# Patient Record
Sex: Male | Born: 1985 | Race: Black or African American | Hispanic: No | Marital: Married | Smoking: Current some day smoker
Health system: Southern US, Community
[De-identification: ages and names within clinical notes are randomized; demographics above are authoritative.]

## PROBLEM LIST (undated history)

## (undated) HISTORY — PX: ROTATOR CUFF REPAIR: SHX139

---
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
## 2006-07-01 ENCOUNTER — Emergency Department (HOSPITAL_COMMUNITY): Admission: EM | Admit: 2006-07-01 | Discharge: 2006-07-01 | Payer: Self-pay | Admitting: Emergency Medicine

## 2006-09-01 ENCOUNTER — Emergency Department (HOSPITAL_COMMUNITY): Admission: EM | Admit: 2006-09-01 | Discharge: 2006-09-01 | Payer: Self-pay | Admitting: Emergency Medicine

## 2007-09-13 ENCOUNTER — Emergency Department (HOSPITAL_COMMUNITY): Admission: EM | Admit: 2007-09-13 | Discharge: 2007-09-13 | Payer: Self-pay | Admitting: Emergency Medicine

## 2007-12-12 ENCOUNTER — Emergency Department (HOSPITAL_COMMUNITY): Admission: EM | Admit: 2007-12-12 | Discharge: 2007-12-12 | Payer: Self-pay | Admitting: Emergency Medicine

## 2009-01-09 ENCOUNTER — Emergency Department (HOSPITAL_COMMUNITY): Admission: EM | Admit: 2009-01-09 | Discharge: 2009-01-09 | Payer: Self-pay | Admitting: Emergency Medicine

## 2011-09-28 LAB — STREP A DNA PROBE: Group A Strep Probe: NEGATIVE

## 2011-09-28 LAB — RAPID STREP SCREEN (MED CTR MEBANE ONLY): Streptococcus, Group A Screen (Direct): NEGATIVE

## 2011-10-04 LAB — RAPID STREP SCREEN (MED CTR MEBANE ONLY): Streptococcus, Group A Screen (Direct): NEGATIVE

## 2011-10-04 LAB — STREP A DNA PROBE

## 2012-06-18 ENCOUNTER — Emergency Department (HOSPITAL_COMMUNITY)
Admission: EM | Admit: 2012-06-18 | Discharge: 2012-06-18 | Disposition: A | Discharge status: Home / Self Care | Payer: BC Managed Care – PPO | Attending: Emergency Medicine | Admitting: Emergency Medicine

## 2012-06-18 ENCOUNTER — Encounter (HOSPITAL_COMMUNITY): Payer: Self-pay | Admitting: Emergency Medicine

## 2012-06-18 DIAGNOSIS — K1379 Other lesions of oral mucosa: Secondary | ICD-10-CM

## 2012-06-18 DIAGNOSIS — F17200 Nicotine dependence, unspecified, uncomplicated: Secondary | ICD-10-CM | POA: Insufficient documentation

## 2012-06-18 DIAGNOSIS — R6884 Jaw pain: Secondary | ICD-10-CM | POA: Insufficient documentation

## 2012-06-18 MED ORDER — IBUPROFEN 800 MG PO TABS
800.0000 mg | ORAL_TABLET | Freq: Once | ORAL | Status: AC
  Administered 2012-06-18: 800 mg via ORAL
  Filled 2012-06-18: qty 1

## 2012-06-18 NOTE — ED Notes (Signed)
Patient complaining of right jaw pain, states feels a knot.

## 2012-06-18 NOTE — ED Provider Notes (Signed)
History     CSN: [id number]  Arrival date & time 06/18/12  0016   First MD Initiated Contact with Patient 06/18/12 0028      Chief Complaint  Patient presents with  . Jaw Pain    (Consider location/radiation/quality/duration/timing/severity/associated sxs/prior treatment) HPI  Evan White is a 26 y.o. male who presents to the Emergency Department complaining of  Pain to lower right jaw and continued area inside his right cheek that he bites when eating food. He has used ora gel with no relief. History reviewed. No pertinent past medical history.  Past Surgical History  Procedure Date  . Rotator cuff repair     right    History reviewed. No pertinent family history.  History  Substance Use Topics  . Smoking status: Current Some Day Smoker    Types: Cigars  . Smokeless tobacco: Not on file  . Alcohol Use: Yes     occasionally      Review of Systems  Constitutional: Negative for fever.       10 Systems reviewed and are negative for acute change except as noted in the HPI.  HENT: Negative for congestion.        Jaw pain  Eyes: Negative for discharge and redness.  Respiratory: Negative for cough and shortness of breath.   Cardiovascular: Negative for chest pain.  Gastrointestinal: Negative for vomiting and abdominal pain.  Musculoskeletal: Negative for back pain.  Skin: Negative for rash.  Neurological: Negative for syncope, numbness and headaches.  Psychiatric/Behavioral:       No behavior change.    Allergies  Review of patient's allergies indicates no known allergies.  Home Medications  No current outpatient prescriptions on file.  BP 138/80  Pulse 60  Temp 98.4 F (36.9 C) (Oral)  Resp 16  Ht 6' (1.829 m)  Wt 150 lb (68.04 kg)  BMI 20.34 kg/m2  SpO2 100%  Physical Exam  Nursing note and vitals reviewed. Constitutional:       Awake, alert, nontoxic appearance.  HENT:  Head: Atraumatic.       Small area inside right lower cheek that is  bruised.   Eyes: Right eye exhibits no discharge. Left eye exhibits no discharge.  Neck: Neck supple.  Cardiovascular: Normal rate.   Pulmonary/Chest: Effort normal and breath sounds normal. He exhibits no tenderness.  Abdominal: Soft. There is no tenderness. There is no rebound.  Musculoskeletal: He exhibits no tenderness.       Baseline ROM, no obvious new focal weakness.  Neurological:       Mental status and motor strength appears baseline for patient and situation.  Skin: No rash noted.  Psychiatric: He has a normal mood and affect.    ED Course  Procedures (including critical care time)     MDM  Patient with a painful area inside his right cheek where he has been biting her cheek when he eats. No evidence of infection. Given an anti-inflammatory.Pt stable in ED with no significant deterioration in condition.The patient appears reasonably screened and/or stabilized for discharge and I doubt any other medical condition or other [facility] requiring further screening, evaluation, or treatment in the ED at this time prior to discharge.  MDM Reviewed: nursing note           Nicoletta Dress. Colon Branch, MD 06/18/12 [phone]

## 2012-06-18 NOTE — Discharge Instructions (Signed)
Use salt water rinses to the area. Use tylenol or ibuprofen for the pain. Eat carefully trying to avoid biting the area for several days. It has to heal for the swelling to go down.

## 2012-10-03 ENCOUNTER — Encounter (HOSPITAL_COMMUNITY): Payer: Self-pay | Admitting: Emergency Medicine

## 2012-10-03 ENCOUNTER — Emergency Department (HOSPITAL_COMMUNITY)
Admission: EM | Admit: 2012-10-03 | Discharge: 2012-10-03 | Disposition: A | Discharge status: Home / Self Care | Payer: BC Managed Care – PPO | Attending: Emergency Medicine | Admitting: Emergency Medicine

## 2012-10-03 DIAGNOSIS — K047 Periapical abscess without sinus: Secondary | ICD-10-CM | POA: Insufficient documentation

## 2012-10-03 DIAGNOSIS — F17200 Nicotine dependence, unspecified, uncomplicated: Secondary | ICD-10-CM | POA: Insufficient documentation

## 2012-10-03 MED ORDER — HYDROCODONE-ACETAMINOPHEN 5-325 MG PO TABS
1.0000 | ORAL_TABLET | Freq: Once | ORAL | Status: AC
  Administered 2012-10-03: 1 via ORAL
  Filled 2012-10-03: qty 1

## 2012-10-03 MED ORDER — PENICILLIN V POTASSIUM 250 MG PO TABS
500.0000 mg | ORAL_TABLET | Freq: Once | ORAL | Status: AC
  Administered 2012-10-03: 500 mg via ORAL
  Filled 2012-10-03: qty 2

## 2012-10-03 MED ORDER — PENICILLIN V POTASSIUM 500 MG PO TABS: 500.0000 mg | ORAL_TABLET | Freq: Four times a day (QID) | ORAL | Status: AC

## 2012-10-03 MED ORDER — HYDROCODONE-ACETAMINOPHEN 5-325 MG PO TABS: 1.0000 | ORAL_TABLET | Freq: Four times a day (QID) | ORAL | Status: AC | PRN

## 2012-10-03 MED ORDER — IBUPROFEN 800 MG PO TABS
800.0000 mg | ORAL_TABLET | Freq: Once | ORAL | Status: AC
  Administered 2012-10-03: 800 mg via ORAL
  Filled 2012-10-03: qty 1

## 2012-10-03 NOTE — ED Provider Notes (Signed)
History     CSN: [id number]  Arrival date & time 10/03/12  1040   First MD Initiated Contact with Patient 10/03/12 1100      Chief Complaint  Patient presents with  . Dental Pain    (Consider location/radiation/quality/duration/timing/severity/associated sxs/prior treatment) HPI Comments: Pain and swelling to L jaw began 3 days ago and has worsened since then.  No fever or chills.  No dentist.  Localizes pain to L lower wisdom tooth   Patient is a 26 y.o. male presenting with tooth pain. The history is provided by the patient. No language interpreter was used.  Dental PainThe primary symptoms include mouth pain. Primary symptoms do not include dental injury or fever.  Additional symptoms include: facial swelling. Additional symptoms do not include: purulent gums.    History reviewed. No pertinent past medical history.  Past Surgical History  Procedure Date  . Rotator cuff repair     right    History reviewed. No pertinent family history.  History  Substance Use Topics  . Smoking status: Current Some Day Smoker    Types: Cigars  . Smokeless tobacco: Not on file  . Alcohol Use: Yes     occasionally      Review of Systems  Constitutional: Negative for fever and chills.  HENT: Positive for facial swelling and dental problem.   All other systems reviewed and are negative.    Allergies  Review of patient's allergies indicates no known allergies.  Home Medications   Current Outpatient Rx  Name Route Sig Dispense Refill  . IBUPROFEN 200 MG PO TABS Oral Take 400 mg by mouth every 6 (six) hours as needed. For pain      BP 129/69  Pulse 72  Temp 99.8 F (37.7 C) (Oral)  Resp 18  Ht 6' (1.829 m)  Wt 145 lb (65.772 kg)  BMI 19.67 kg/m2  SpO2 100%  Physical Exam  Nursing note and vitals reviewed. Constitutional: He is oriented to person, place, and time. He appears well-developed and well-nourished.  HENT:  Head: Normocephalic and atraumatic.    Mouth/Throat: Uvula is midline, oropharynx is clear and moist and mucous membranes are normal. Normal dentition. Dental abscesses present. No uvula swelling or dental caries.    Eyes: EOM are normal.  Neck: Normal range of motion.  Cardiovascular: Normal rate, regular rhythm, normal heart sounds and intact distal pulses.   Pulmonary/Chest: Effort normal and breath sounds normal. No respiratory distress.  Abdominal: Soft. He exhibits no distension. There is no tenderness.  Musculoskeletal: Normal range of motion.  Lymphadenopathy:       Right cervical: No superficial cervical and no deep cervical adenopathy present.      Left cervical: No superficial cervical and no deep cervical adenopathy present.  Neurological: He is alert and oriented to person, place, and time.  Skin: Skin is warm and dry.  Psychiatric: He has a normal mood and affect. Judgment normal.    ED Course  Procedures (including critical care time)  Labs Reviewed - No data to display No results found.   1. Dental abscess       MDM  rx-pen VK 500, 40 rx-hydrocodone, 20 Ibuprofen F/u with dentist of your choice ASAP        Evalina Field, PA 10/03/12 1238

## 2012-10-03 NOTE — ED Notes (Signed)
Pt c/o dental pain since Monday.  

## 2012-10-03 NOTE — ED Provider Notes (Signed)
Medical screening examination/treatment/procedure(s) were performed by non-physician practitioner and as supervising physician I was immediately available for consultation/collaboration.   Dione Booze, MD 10/03/12 1504

## 2015-05-26 ENCOUNTER — Emergency Department (HOSPITAL_COMMUNITY)
Admission: EM | Admit: 2015-05-26 | Discharge: 2015-05-26 | Disposition: A | Discharge status: Home / Self Care | Payer: Self-pay | Attending: Emergency Medicine | Admitting: Emergency Medicine

## 2015-05-26 ENCOUNTER — Encounter (HOSPITAL_COMMUNITY): Payer: Self-pay | Admitting: Emergency Medicine

## 2015-05-26 DIAGNOSIS — K011 Impacted teeth: Secondary | ICD-10-CM | POA: Insufficient documentation

## 2015-05-26 DIAGNOSIS — Z87891 Personal history of nicotine dependence: Secondary | ICD-10-CM | POA: Insufficient documentation

## 2015-05-26 MED ORDER — IBUPROFEN 400 MG PO TABS
600.0000 mg | ORAL_TABLET | Freq: Once | ORAL | Status: DC
  Filled 2015-05-26: qty 2

## 2015-05-26 MED ORDER — ACETAMINOPHEN 500 MG PO TABS
1000.0000 mg | ORAL_TABLET | Freq: Once | ORAL | Status: AC
  Administered 2015-05-26: 1000 mg via ORAL
  Filled 2015-05-26: qty 2

## 2015-05-26 MED ORDER — TRAMADOL HCL 50 MG PO TABS: 100.0000 mg | ORAL_TABLET | Freq: Four times a day (QID) | ORAL | Status: DC | PRN

## 2015-05-26 MED ORDER — PENICILLIN V POTASSIUM 250 MG PO TABS
500.0000 mg | ORAL_TABLET | Freq: Once | ORAL | Status: AC
  Administered 2015-05-26: 500 mg via ORAL
  Filled 2015-05-26: qty 2

## 2015-05-26 MED ORDER — NAPROXEN 500 MG PO TABS: ORAL_TABLET | ORAL | Status: DC

## 2015-05-26 MED ORDER — PENICILLIN V POTASSIUM 500 MG PO TABS: 500.0000 mg | ORAL_TABLET | Freq: Four times a day (QID) | ORAL | Status: AC

## 2015-05-26 MED ORDER — TRAMADOL HCL 50 MG PO TABS
100.0000 mg | ORAL_TABLET | Freq: Once | ORAL | Status: AC
  Administered 2015-05-26: 100 mg via ORAL
  Filled 2015-05-26: qty 2

## 2015-05-26 NOTE — ED Provider Notes (Addendum)
CSN: [id number]     Arrival date & time 05/26/15  0057 History   First MD Initiated Contact with Patient 05/26/15 0150     Chief Complaint  Patient presents with  . Dental Pain     (Consider location/radiation/quality/duration/timing/severity/associated sxs/prior Treatment) HPI patient states he has had some problems with his right lower wisdom tooth off and on for the past 5-6 years. He states he went to a dentist about 3 or 4 years ago and they referred him to an oral surgeon however he got better and he did not go. He states he ate steak last week and he started having some discomfort in his right lower molar. He states "the tooth comes up and goes down". He denies any fever or facial swelling. He denies difficulty swallowing or breathing.  PCP Dr Sherwood GamblerFusco  History reviewed. No pertinent past medical history. Past Surgical History  Procedure Laterality Date  . Rotator cuff repair      right   History reviewed. No pertinent family history. History  Substance Use Topics  . Smoking status: Former Smoker    Types: Cigars  . Smokeless tobacco: Not on file  . Alcohol Use: Yes     Comment: occasionally  employed at [employer]  Review of Systems  All other systems reviewed and are negative.     Allergies  Review of patient's allergies indicates no known allergies.  Home Medications   Prior to Admission medications   Medication Sig Start Date End Date Taking? Authorizing Provider  ibuprofen (ADVIL,MOTRIN) 200 MG tablet Take 400 mg by mouth every 6 (six) hours as needed. For pain    Historical Provider, MD  naproxen (NAPROSYN) 500 MG tablet Take 1 po BID with food prn pain 05/26/15   Devoria AlbeIva Norma Ignasiak, MD  penicillin v potassium (VEETID) 500 MG tablet Take 1 tablet (500 mg total) by mouth 4 (four) times daily. 05/26/15 06/02/15  Devoria AlbeIva Ramiah Helfrich, MD  traMADol (ULTRAM) 50 MG tablet Take 2 tablets (100 mg total) by mouth every 6 (six) hours as needed. 05/26/15   Devoria AlbeIva Karn Derk, MD   BP 125/75 mmHg  Pulse 68   Temp(Src) 98.5 F (36.9 C)  Resp 18  Ht 6' (1.829 m)  Wt 145 lb (65.772 kg)  BMI 19.66 kg/m2  SpO2 100%  Vital signs normal   Physical Exam  Constitutional: He is oriented to person, place, and time. He appears well-developed and well-nourished.  Non-toxic appearance. He does not appear ill. No distress.  HENT:  Head: Normocephalic and atraumatic.  Right Ear: External ear normal.  Left Ear: External ear normal.  Nose: Nose normal. No mucosal edema or rhinorrhea.  Mouth/Throat: Oropharynx is clear and moist and mucous membranes are normal. No dental abscesses or uvula swelling.  Patient is noted to have a impacted right lower molar. He does not have widespread dental disease or decay. He does not have any facial swelling. He does not have trismus.  Eyes: Conjunctivae and EOM are normal. Pupils are equal, round, and reactive to light.  Neck: Normal range of motion and full passive range of motion without pain. Neck supple.  Pulmonary/Chest: Effort normal. No respiratory distress. He has no rhonchi. He exhibits no crepitus.  Abdominal: Soft. Normal appearance and bowel sounds are normal.  Musculoskeletal: Normal range of motion. He exhibits no edema or tenderness.  Moves all extremities well.   Neurological: He is alert and oriented to person, place, and time. He has normal strength. No cranial nerve deficit.  Skin:  Skin is warm, dry and intact. No rash noted. No erythema. No pallor.  Psychiatric: He has a normal mood and affect. His speech is normal and behavior is normal. His mood appears not anxious.  Nursing note and vitals reviewed.   ED Course  Procedures (including critical care time)  Medications  ibuprofen (ADVIL,MOTRIN) tablet 600 mg (600 mg Oral Not Given 05/26/15 0236)  traMADol (ULTRAM) tablet 100 mg (100 mg Oral Given 05/26/15 0233)  acetaminophen (TYLENOL) tablet 1,000 mg (1,000 mg Oral Given 05/26/15 0233)  penicillin v potassium (VEETID) tablet 500 mg (500 mg Oral Given  05/26/15 0233)    We discussed that his tooth "doesn't come in and out", but the gum grows over the tooth  Patient is interested in oral surgery referral. He was given 2 names of oral surgeons in [place].   Labs Review Labs Reviewed - No data to display  Imaging Review No results found.   EKG Interpretation None      MDM   Final diagnoses:  Impacted third molar tooth   Discharge Medication List as of 05/26/2015  2:12 AM    START taking these medications   Details  naproxen (NAPROSYN) 500 MG tablet Take 1 po BID with food prn pain, Print    penicillin v potassium (VEETID) 500 MG tablet Take 1 tablet (500 mg total) by mouth 4 (four) times daily., Starting 05/26/2015, Until Thu 06/02/15, Print    traMADol (ULTRAM) 50 MG tablet Take 2 tablets (100 mg total) by mouth every 6 (six) hours as needed., Starting 05/26/2015, Until Discontinued, Print        Plan discharge  Devoria Albe, MD, Concha Pyo, MD 05/26/15 4098  Devoria Albe, MD 05/26/15 [phone]

## 2015-05-26 NOTE — ED Notes (Signed)
Dr Knapp at bedside,  

## 2015-05-26 NOTE — Discharge Instructions (Signed)
Take the medications as prescribed. I gave you the name of 2 oral surgeons, call the office to get an appointment. Return to the ED if you get a fever or swelling of your jaw.    Impacted Molar Molars are the teeth in the back of your mouth. You have 12 molars. There are 6 molars in each jaw, 3 on each side. When they grow in (erupt) they sometimes cause problems. Molars trapped inside the gum are impacted molars. Impacted molars may grow sideways, tilted, or may only partially emerge. Molars erupt at different times in life. The first set of molars usually erupts around 43 to 28 years of age. The second set of molars typically erupts around 32 to 27 years of age. The third set of molars are called wisdom teeth. These molars usually do not have enough space to erupt properly. Many teens and young adults develop impacted wisdom teeth and have them surgically removed (extracted). However, any molar or set of molars may become impacted. CAUSES  Teeth that are crowded are often the reason for an impacted molar, but sometimes a cyst or tumor may cause impaction of molars. SYMPTOMS  Sometimes there are no symptoms and an impacted molar is noticed during an exam or X-ray. If there are symptoms they may include:  Pain.  Swelling, redness, or inflammation near the impacted tooth or teeth.  Stiff jaw.  General feeling of illness.  Bad breath.  Gap between the teeth.  Difficulty opening your mouth.  Headache or jaw ache.  Swollen lymph nodes. Impacted teeth may increase the risk of complications such as:  Infection, with possible drainage around the infected area.  Damage to nearby teeth.  Growth of cysts.  Chronic discomfort. DIAGNOSIS  Impacted molars are diagnosed by oral exam and X-rays. TREATMENT  The goal of treatment is to obtain the best possible arrangement of your teeth. Your dentist or orthodontist will recommend the best course of action for you. After an exam, your  caregiver may recommend one or a combination of the following treatments.  Supportive home care to manage pain and other symptoms until treatment can be started.  Surgical extraction of one or a combination of molars to leave room for emerging or later molars. Teeth must be extracted at appropriate times for the best results.  Surgical uncovering of tissue covering the impacted molar.  Orthodontic repositioning with the use of appliances such as elastic or metal separators, braces, wires, springs, and other removable or fixed devices. This is done to guide the molar and surrounding teeth to grow in properly. In some cases, you may need some surgery to assist this procedure. Follow-up orthodontic treatment is often necessary with impacted first and second molars.  Antibiotics to treat infection. HOME CARE INSTRUCTIONS Rinse as directed with an antibacterial solution or salt and warm water. Follow up with your caregiver as directed, even if you do not have symptoms. If you are waiting for treatment and have pain:  Take pain medicines as directed.  Take your antibiotics as directed. Finish them even if you start to feel better.  Put ice on the affected area.  Put ice in a plastic bag.  Place a towel between your skin and the bag.  Leave the ice on for 15-20 minutes, 03-04 times a day. SEEK DENTAL CARE IF:  You have a fever.  Pain emerges, worsens, or is not controlled by the medicines you were given.  Swelling occurs.  You have difficulty opening your mouth  or swallowing. MAKE SURE YOU:   Understand these instructions.  Will watch your condition.  Will get help right away if you are not doing well or get worse. Document Released: 08/08/2011 Document Revised: 03/03/2012 Document Reviewed: 08/08/2011 [facility] Patient Information 2015 [employer] [place]. This information is not intended to replace advice given to you by your health care provider. Make sure you discuss any questions  you have with your health care provider.

## 2015-05-26 NOTE — ED Notes (Signed)
Pt c/o dental pain x one week.  

## 2016-04-14 ENCOUNTER — Emergency Department (HOSPITAL_COMMUNITY)
Admission: EM | Admit: 2016-04-14 | Discharge: 2016-04-14 | Disposition: A | Discharge status: Home / Self Care | Payer: Self-pay | Attending: Emergency Medicine | Admitting: Emergency Medicine

## 2016-04-14 ENCOUNTER — Encounter (HOSPITAL_COMMUNITY): Payer: Self-pay | Admitting: *Deleted

## 2016-04-14 DIAGNOSIS — Z87891 Personal history of nicotine dependence: Secondary | ICD-10-CM | POA: Insufficient documentation

## 2016-04-14 DIAGNOSIS — H6592 Unspecified nonsuppurative otitis media, left ear: Secondary | ICD-10-CM

## 2016-04-14 DIAGNOSIS — H938X2 Other specified disorders of left ear: Secondary | ICD-10-CM | POA: Insufficient documentation

## 2016-04-14 MED ORDER — FLUTICASONE PROPIONATE 50 MCG/ACT NA SUSP: 2.0000 | Freq: Every day | NASAL | Status: DC

## 2016-04-14 MED ORDER — SALINE SPRAY 0.65 % NA SOLN: 1.0000 | NASAL | Status: DC | PRN

## 2016-04-14 NOTE — Discharge Instructions (Signed)
Serous Otitis Media Serous otitis media is fluid in the middle ear space. This space contains the bones for hearing and air. Air in the middle ear space helps to transmit sound.  The air gets there through the eustachian tube. This tube goes from the back of the nose (nasopharynx) to the middle ear space. It keeps the pressure in the middle ear the same as the outside world. It also helps to drain fluid from the middle ear space. CAUSES  Serous otitis media occurs when the eustachian tube gets blocked. Blockage can come from:  Ear infections.  Colds and other upper respiratory infections.  Allergies.  Irritants such as cigarette smoke.  Sudden changes in air pressure (such as descending in an airplane).  Enlarged adenoids.  A mass in the nasopharynx. During colds and upper respiratory infections, the middle ear space can become temporarily filled with fluid. This can happen after an ear infection also. Once the infection clears, the fluid will generally drain out of the ear through the eustachian tube. If it does not, then serous otitis media occurs. SIGNS AND SYMPTOMS   Hearing loss.  A feeling of fullness in the ear, without pain.  Young children may not show any symptoms but may show slight behavioral changes, such as agitation, ear pulling, or crying. DIAGNOSIS  Serous otitis media is diagnosed by an ear exam. Tests may be done to check on the movement of the eardrum. Hearing exams may also be done. TREATMENT  The fluid most often goes away without treatment. If allergy is the cause, allergy treatment may be helpful. Fluid that persists for several months may require minor surgery. A small tube is placed in the eardrum to:  Drain the fluid.  Restore the air in the middle ear space. In certain situations, antibiotic medicines are used to avoid surgery. Surgery may be done to remove enlarged adenoids (if this is the cause). HOME CARE INSTRUCTIONS   Keep children away from  tobacco smoke.  Keep all follow-up visits as directed by your health care provider. SEEK MEDICAL CARE IF:   Your hearing is not better in 3 months.  Your hearing is worse.  You have ear pain.  You have drainage from the ear.  You have dizziness.  You have serous otitis media only in one ear or have any bleeding from your nose (epistaxis).  You notice a lump on your neck. MAKE SURE YOU:  Understand these instructions.   Will watch your condition.   Will get help right away if you are not doing well or get worse.    This information is not intended to replace advice given to you by your health care provider. Make sure you discuss any questions you have with your health care provider.   Document Released: 03/01/2004 Document Revised: 12/31/2014 Document Reviewed: 07/07/2013 Elsevier Interactive Patient Education 2016 [employer]  

## 2016-04-14 NOTE — ED Provider Notes (Signed)
CSN: [id number]     Arrival date & time 04/14/16  0002 History   First MD Initiated Contact with Patient 04/14/16 0030     Chief Complaint  Patient presents with  . Ear Fullness     (Consider location/radiation/quality/duration/timing/severity/associated sxs/prior Treatment) HPI  This is a 30 year old male who presents with left ear fullness. Patient reports one-week history of his left ear feeling "stopped up." Denies hearing loss. Denies pain. Denies any nasal congestion, rhinorrhea, sneezing, watery eyes, sore throat. States that his ear on the left specifically occasionally will "stop up" but usually it improves on its own. He has no other complaints.  History reviewed. No pertinent past medical history. Past Surgical History  Procedure Laterality Date  . Rotator cuff repair      right   No family history on file. Social History  Substance Use Topics  . Smoking status: Former Smoker    Types: Cigars  . Smokeless tobacco: None  . Alcohol Use: Yes     Comment: occasionally    Review of Systems  Constitutional: Negative for fever.  HENT: Negative for congestion, ear discharge, ear pain, hearing loss, rhinorrhea, sneezing and sore throat.        Ear fullness  All other systems reviewed and are negative.     Allergies  Review of patient's allergies indicates no known allergies.  Home Medications   Prior to Admission medications   Medication Sig Start Date End Date Taking? Authorizing Provider  fluticasone (FLONASE) 50 MCG/ACT nasal spray Place 2 sprays into both nostrils daily. 04/14/16   Shon Batonourtney F Horton, MD  ibuprofen (ADVIL,MOTRIN) 200 MG tablet Take 400 mg by mouth every 6 (six) hours as needed. For pain    Historical Provider, MD  naproxen (NAPROSYN) 500 MG tablet Take 1 po BID with food prn pain 05/26/15   Devoria AlbeIva Knapp, MD  sodium chloride (OCEAN) 0.65 % SOLN nasal spray Place 1 spray into both nostrils as needed for congestion. 04/14/16   Shon Batonourtney F Horton, MD   traMADol (ULTRAM) 50 MG tablet Take 2 tablets (100 mg total) by mouth every 6 (six) hours as needed. 05/26/15   Devoria AlbeIva Knapp, MD   BP 129/81 mmHg  Pulse 61  Temp(Src) 98.3 F (36.8 C) (Oral)  Resp 18  Ht 6' (1.829 m)  Wt 142 lb (64.411 kg)  BMI 19.25 kg/m2  SpO2 100% Physical Exam  Constitutional: He is oriented to person, place, and time. He appears well-developed and well-nourished.  HENT:  Head: Normocephalic and atraumatic.  Right external auditory canal and TM normal Left external auditory canal with partial blockage with cerumen, effusion noted behind TM, mild erythema, light reflex intact  Eyes: Conjunctivae are normal.  Cardiovascular: Normal rate and regular rhythm.   Pulmonary/Chest: Effort normal. No respiratory distress.  Neurological: He is alert and oriented to person, place, and time.  Skin: Skin is warm and dry.  Psychiatric: He has a normal mood and affect.  Nursing note and vitals reviewed.   ED Course  Procedures (including critical care time) Labs Review Labs Reviewed - No data to display  Imaging Review No results found. I have personally reviewed and evaluated these images and lab results as part of my medical decision-making.   EKG Interpretation None      MDM   Final diagnoses:  Middle ear effusion, left    Patient presents with left ear fullness. Systemically well appearing. He has evidence of ear effusion and a small amount of cerumen in that  ear. There is no obvious infection. Discussed with patient supportive measures with nasal saline and a nasal steroid. Follow-up with ENT if symptoms persist.  After history, exam, and medical workup I feel the patient has been appropriately medically screened and is safe for discharge home. Pertinent diagnoses were discussed with the patient. Patient was given return precautions.     Shon Baton, MD 04/14/16 [phone]

## 2016-04-14 NOTE — ED Notes (Signed)
MD at bedside. 

## 2016-04-14 NOTE — ED Notes (Signed)
Pt states his left ear feels stopped up for the past week.

## 2018-12-20 ENCOUNTER — Encounter (HOSPITAL_COMMUNITY): Payer: Self-pay | Admitting: Emergency Medicine

## 2018-12-20 ENCOUNTER — Other Ambulatory Visit: Payer: Self-pay

## 2018-12-20 ENCOUNTER — Emergency Department (HOSPITAL_COMMUNITY)
Admission: EM | Admit: 2018-12-20 | Discharge: 2018-12-20 | Disposition: A | Discharge status: Home / Self Care | Payer: Self-pay | Attending: Emergency Medicine | Admitting: Emergency Medicine

## 2018-12-20 DIAGNOSIS — Z87891 Personal history of nicotine dependence: Secondary | ICD-10-CM | POA: Insufficient documentation

## 2018-12-20 DIAGNOSIS — J111 Influenza due to unidentified influenza virus with other respiratory manifestations: Secondary | ICD-10-CM

## 2018-12-20 DIAGNOSIS — Z79899 Other long term (current) drug therapy: Secondary | ICD-10-CM | POA: Insufficient documentation

## 2018-12-20 DIAGNOSIS — J101 Influenza due to other identified influenza virus with other respiratory manifestations: Secondary | ICD-10-CM | POA: Insufficient documentation

## 2018-12-20 LAB — INFLUENZA PANEL BY PCR (TYPE A & B)
INFLBPCR: POSITIVE — AB
Influenza A By PCR: NEGATIVE

## 2018-12-20 MED ORDER — OSELTAMIVIR PHOSPHATE 75 MG PO CAPS: 75.0000 mg | ORAL_CAPSULE | Freq: Two times a day (BID) | ORAL | 0 refills | Status: DC

## 2018-12-20 NOTE — ED Triage Notes (Signed)
Patient body aches, chills, and fatigue. Denies any nausea, vomiting, or diarrhea. Per patient symptoms started on Christmas. Occasional cough and nasal drainage.

## 2018-12-20 NOTE — Discharge Instructions (Signed)
Rest,  Drink plenty of fluids.  Take motrin or tylenol for achiness and fever reduction.  You may take the tamiflu if you desire.  This medicine may improve your flu symptoms 1-2 days sooner but you do not have to take this medicine to get over the flu.  Get rechecked for increased shortness of breath,  Increased fever or increasing weakness.

## 2018-12-20 NOTE — ED Provider Notes (Signed)
[facility] EMERGENCY DEPARTMENT Provider Note   CSN: [id number] Arrival date & time: 12/20/18  1816     History   Chief Complaint Chief Complaint  Patient presents with  . Generalized Body Aches    HPI Evan White is a 32 y.o. male presenting with a 3 day history of uri type symptoms which includes nasal congestion with clear rhinorrhea, body aches, fatigue,  low grade fever and nonproductive cough.  Symptoms do not include shortness of breath, chest pain,  Nausea, vomiting or diarrhea.  The patient has had no treatment prior to arrival.   The history is provided by the patient.    History reviewed. No pertinent past medical history.  There are no active problems to display for this patient.   Past Surgical History:  Procedure Laterality Date  . ROTATOR CUFF REPAIR     right        Home Medications    Prior to Admission medications   Medication Sig Start Date End Date Taking? Authorizing Provider  fluticasone (FLONASE) 50 MCG/ACT nasal spray Place 2 sprays into both nostrils daily. 04/14/16   Horton, Mayer Maskerourtney F, MD  ibuprofen (ADVIL,MOTRIN) 200 MG tablet Take 400 mg by mouth every 6 (six) hours as needed. For pain    [provider]  naproxen (NAPROSYN) 500 MG tablet Take 1 po BID with food prn pain 05/26/15   Devoria AlbeKnapp, Iva, MD  oseltamivir (TAMIFLU) 75 MG capsule Take 1 capsule (75 mg total) by mouth every 12 (twelve) hours. 12/20/18   Burgess AmorIdol, Omarion Minnehan, PA-C  sodium chloride (OCEAN) 0.65 % SOLN nasal spray Place 1 spray into both nostrils as needed for congestion. 04/14/16   Horton, Mayer Maskerourtney F, MD  traMADol (ULTRAM) 50 MG tablet Take 2 tablets (100 mg total) by mouth every 6 (six) hours as needed. 05/26/15   Devoria AlbeKnapp, Iva, MD    Family History No family history on file.  Social History Social History   Tobacco Use  . Smoking status: Former Smoker    Types: Cigars  . Smokeless tobacco: Never Used  Substance Use Topics  . Alcohol use: Yes    Comment:  occasionally  . Drug use: No     Allergies   Patient has no known allergies.   Review of Systems Review of Systems  Constitutional: Positive for chills and fever.  HENT: Positive for congestion and rhinorrhea. Negative for ear pain, sinus pressure, sore throat, trouble swallowing and voice change.   Eyes: Negative for discharge.  Respiratory: Positive for cough. Negative for shortness of breath, wheezing and stridor.   Cardiovascular: Negative for chest pain.  Gastrointestinal: Negative for abdominal pain, diarrhea, nausea and vomiting.  Genitourinary: Negative.   Musculoskeletal: Positive for myalgias. Negative for neck pain and neck stiffness.     Physical Exam Updated Vital Signs BP 130/69 (BP Location: Right Arm)   Pulse 100   Temp 100.3 F (37.9 C) (Oral)   Resp 18   Ht 6' (1.829 m)   Wt 68 kg   SpO2 100%   BMI 20.34 kg/m   Physical Exam Constitutional:      Appearance: He is well-developed.  HENT:     Head: Normocephalic and atraumatic.     Right Ear: Tympanic membrane and ear canal normal.     Left Ear: Tympanic membrane and ear canal normal.     Nose: Mucosal edema and rhinorrhea present.     Mouth/Throat:     Pharynx: Uvula midline. No oropharyngeal exudate or posterior  oropharyngeal erythema.     Tonsils: No tonsillar abscesses.  Eyes:     Conjunctiva/sclera: Conjunctivae normal.  Cardiovascular:     Rate and Rhythm: Normal rate.     Heart sounds: Normal heart sounds.  Pulmonary:     Effort: Pulmonary effort is normal. No respiratory distress.     Breath sounds: Normal breath sounds. No wheezing or rales.  Abdominal:     Palpations: Abdomen is soft.     Tenderness: There is no abdominal tenderness.  Musculoskeletal: Normal range of motion.  Skin:    General: Skin is warm and dry.     Findings: No rash.  Neurological:     Mental Status: He is alert and oriented to person, place, and time.      ED Treatments / Results  Labs (all labs  ordered are listed, but only abnormal results are displayed) Labs Reviewed  INFLUENZA PANEL BY PCR (TYPE A & B) - Abnormal; Notable for the following components:      Result Value   Influenza B By PCR POSITIVE (*)    All other components within normal limits    EKG None  Radiology No results found.  Procedures Procedures (including critical care time)  Medications Ordered in ED Medications - No data to display   Initial Impression / Assessment and Plan / ED Course  I have reviewed the triage vital signs and the nursing notes.  Pertinent labs & imaging results that were available during my care of the patient were reviewed by me and considered in my medical decision making (see chart for details).     Lab results reviewed and discussed with patient.  We discussed home treatments and supportive care, also discussed the role of Tamiflu.  Patient is desirous of starting this medication.  Discussed return precautions.  PRN follow-up anticipated.  Final Clinical Impressions(s) / ED Diagnoses   Final diagnoses:  Influenza    ED Discharge Orders         Ordered    oseltamivir (TAMIFLU) 75 MG capsule  Every 12 hours     12/20/18 2006           Victoriano Laindol, Ziyon Soltau, PA-C 12/20/18 2218    Gerhard MunchLockwood, Robert, MD 12/20/18 2351

## 2020-06-28 ENCOUNTER — Encounter (HOSPITAL_COMMUNITY): Payer: Self-pay | Admitting: Emergency Medicine

## 2020-06-28 ENCOUNTER — Emergency Department (HOSPITAL_COMMUNITY): Payer: Self-pay

## 2020-06-28 ENCOUNTER — Other Ambulatory Visit: Payer: Self-pay

## 2020-06-28 ENCOUNTER — Emergency Department (HOSPITAL_COMMUNITY)
Admission: EM | Admit: 2020-06-28 | Discharge: 2020-06-28 | Disposition: A | Discharge status: Home / Self Care | Payer: Self-pay | Attending: Emergency Medicine | Admitting: Emergency Medicine

## 2020-06-28 DIAGNOSIS — Y9234 Swimming pool (public) as the place of occurrence of the external cause: Secondary | ICD-10-CM | POA: Insufficient documentation

## 2020-06-28 DIAGNOSIS — S29012A Strain of muscle and tendon of back wall of thorax, initial encounter: Secondary | ICD-10-CM | POA: Insufficient documentation

## 2020-06-28 DIAGNOSIS — Y9311 Activity, swimming: Secondary | ICD-10-CM | POA: Insufficient documentation

## 2020-06-28 DIAGNOSIS — F17290 Nicotine dependence, other tobacco product, uncomplicated: Secondary | ICD-10-CM | POA: Insufficient documentation

## 2020-06-28 DIAGNOSIS — R109 Unspecified abdominal pain: Secondary | ICD-10-CM | POA: Insufficient documentation

## 2020-06-28 DIAGNOSIS — Y999 Unspecified external cause status: Secondary | ICD-10-CM | POA: Insufficient documentation

## 2020-06-28 DIAGNOSIS — X500XXA Overexertion from strenuous movement or load, initial encounter: Secondary | ICD-10-CM | POA: Insufficient documentation

## 2020-06-28 LAB — URINALYSIS, ROUTINE W REFLEX MICROSCOPIC
Bilirubin Urine: NEGATIVE
Glucose, UA: NEGATIVE mg/dL
Hgb urine dipstick: NEGATIVE
Ketones, ur: NEGATIVE mg/dL
Leukocytes,Ua: NEGATIVE
Nitrite: NEGATIVE
Protein, ur: NEGATIVE mg/dL
Specific Gravity, Urine: 1.021 (ref 1.005–1.030)
pH: 7 (ref 5.0–8.0)

## 2020-06-28 MED ORDER — DICLOFENAC SODIUM 75 MG PO TBEC: 75.0000 mg | DELAYED_RELEASE_TABLET | Freq: Two times a day (BID) | ORAL | 0 refills | Status: AC

## 2020-06-28 MED ORDER — CYCLOBENZAPRINE HCL 10 MG PO TABS
10.0000 mg | ORAL_TABLET | Freq: Once | ORAL | Status: AC
  Administered 2020-06-28: 10 mg via ORAL
  Filled 2020-06-28: qty 1

## 2020-06-28 MED ORDER — CYCLOBENZAPRINE HCL 10 MG PO TABS: 10.0000 mg | ORAL_TABLET | Freq: Three times a day (TID) | ORAL | 0 refills | Status: AC | PRN

## 2020-06-28 MED ORDER — KETOROLAC TROMETHAMINE 60 MG/2ML IM SOLN
60.0000 mg | Freq: Once | INTRAMUSCULAR | Status: AC
  Administered 2020-06-28: 60 mg via INTRAMUSCULAR
  Filled 2020-06-28: qty 2

## 2020-06-28 NOTE — ED Provider Notes (Signed)
[facility] EMERGENCY DEPARTMENT Provider Note   CSN: [id number] Arrival date & time: 06/28/20  2694     History Chief Complaint  Patient presents with  . Flank Pain    Evan White is a 34 y.o. male.  HPI      Evan White is a 34 y.o. male who presents to the Emergency Department complaining of right flank pain for 3 days.  He states that he went swimming x 2 days over the weekend and believes he may have pulled a muscle.  He states the pain has gradually worsened.  He describes a constant pain that is occasionally sharp along his right flank area.  Pain worsens with deep breathing and position change.  He denies shortness of breath.  Pain is nonradiating.  He denies fever, chills, abdominal pain, urine or bowel changes, and low back pain.  He has tried over-the-counter pain relievers without relief.  No history of kidney stones    History reviewed. No pertinent past medical history.  There are no problems to display for this patient.   Past Surgical History:  Procedure Laterality Date  . ROTATOR CUFF REPAIR     right       No family history on file.  Social History   Tobacco Use  . Smoking status: Current Some Day Smoker    Types: Cigars  . Smokeless tobacco: Never Used  Vaping Use  . Vaping Use: Never used  Substance Use Topics  . Alcohol use: Yes    Comment: daily  . Drug use: Yes    Types: Marijuana    Home Medications Prior to Admission medications   Medication Sig Start Date End Date Taking? Authorizing Provider  fluticasone (FLONASE) 50 MCG/ACT nasal spray Place 2 sprays into both nostrils daily. 04/14/16   Horton, Mayer Masker, MD  ibuprofen (ADVIL,MOTRIN) 200 MG tablet Take 400 mg by mouth every 6 (six) hours as needed. For pain    [provider]  naproxen (NAPROSYN) 500 MG tablet Take 1 po BID with food prn pain 05/26/15   Devoria Albe, MD  oseltamivir (TAMIFLU) 75 MG capsule Take 1 capsule (75 mg total) by mouth every 12 (twelve)  hours. 12/20/18   Burgess Amor, PA-C  sodium chloride (OCEAN) 0.65 % SOLN nasal spray Place 1 spray into both nostrils as needed for congestion. 04/14/16   Horton, Mayer Masker, MD  traMADol (ULTRAM) 50 MG tablet Take 2 tablets (100 mg total) by mouth every 6 (six) hours as needed. 05/26/15   Devoria Albe, MD    Allergies    Patient has no known allergies.  Review of Systems   Review of Systems  Constitutional: Negative for fever.  Respiratory: Negative for chest tightness and shortness of breath.   Cardiovascular: Negative for chest pain.  Gastrointestinal: Negative for abdominal pain, constipation, nausea and vomiting.  Genitourinary: Positive for flank pain. Negative for decreased urine volume, difficulty urinating, discharge, dysuria, hematuria, penile pain, penile swelling, scrotal swelling and testicular pain.  Musculoskeletal: Positive for back pain (right mid back pain). Negative for joint swelling.  Skin: Negative for rash.  Neurological: Negative for weakness and numbness.    Physical Exam Updated Vital Signs BP 135/80   Pulse 72   Temp 98 F (36.7 C)   Resp 17   Ht 6' (1.829 m)   Wt 68 kg   SpO2 100%   BMI 20.34 kg/m   Physical Exam Vitals and nursing note reviewed.  Constitutional:  General: He is not in acute distress.    Appearance: Normal appearance. He is not ill-appearing.  HENT:     Mouth/Throat:     Mouth: Mucous membranes are moist.  Cardiovascular:     Rate and Rhythm: Normal rate and regular rhythm.     Pulses: Normal pulses.  Pulmonary:     Effort: Pulmonary effort is normal.     Breath sounds: Normal breath sounds.  Chest:     Chest wall: No tenderness.  Abdominal:     General: There is no distension.     Palpations: Abdomen is soft.     Tenderness: There is no abdominal tenderness. There is no right CVA tenderness or left CVA tenderness.  Musculoskeletal:        General: Normal range of motion.     Cervical back: Normal range of motion.    Skin:    General: Skin is warm.     Capillary Refill: Capillary refill takes less than 2 seconds.  Neurological:     General: No focal deficit present.     Mental Status: He is alert.     Sensory: No sensory deficit.     Motor: No weakness.     ED Results / Procedures / Treatments   Labs (all labs ordered are listed, but only abnormal results are displayed) Labs Reviewed  URINALYSIS, ROUTINE W REFLEX MICROSCOPIC    EKG None  Radiology DG Chest Portable 1 View  Result Date: 06/28/2020 CLINICAL DATA:  Flank pain EXAM: PORTABLE CHEST 1 VIEW COMPARISON:  None. FINDINGS: The heart size and mediastinal contours are within normal limits. Both lungs are clear. The visualized skeletal structures are unremarkable. IMPRESSION: No active disease. Electronically Signed   By: Duanne Guess D.O.   On: 06/28/2020 12:11   CT Renal Stone Study  Result Date: 06/28/2020 CLINICAL DATA:  RIGHT flank pain since Sunday, denies hematuria, denies history of kidney stones; history of smoking EXAM: CT ABDOMEN AND PELVIS WITHOUT CONTRAST TECHNIQUE: Multidetector CT imaging of the abdomen and pelvis was performed following the standard protocol without IV contrast. Sagittal and coronal MPR images reconstructed from axial data set. Oral contrast not administered for this indication. Adult dose reduction software [facility]) was utilized to minimize patient dose. COMPARISON:  None FINDINGS: Lower chest: Lung bases clear Hepatobiliary: Gallbladder and liver normal appearance Pancreas: Normal appearance Spleen: Normal appearance Adrenals/Urinary Tract: Adrenal glands normal appearance. Tiny BILATERAL nonobstructing renal calculi. No hydronephrosis, hydroureter, or ureteral calcification. Bladder decompressed. Stomach/Bowel: Normal appendix, retrocecal. Stomach and bowel loops normal appearance Vascular/Lymphatic: Aorta normal caliber.  No adenopathy. Reproductive: Prostate gland 4.6 x 3.1 x 2.6 cm. Seminal vesicles  unremarkable Other: Tiny amount of free fluid in RIGHT pelvis dependently, nonspecific. No free air or hernia. No definite inflammatory process. Musculoskeletal: Unremarkable IMPRESSION: Tiny BILATERAL nonobstructing renal calculi. Minimal prostatic enlargement for age. Tiny amount of nonspecific free pelvic fluid. Otherwise negative exam. Electronically Signed   By: Ulyses Southward M.D.   On: 06/28/2020 11:31    Procedures Procedures (including critical care time)  Medications Ordered in ED Medications  ketorolac (TORADOL) injection 60 mg (has no administration in time range)  cyclobenzaprine (FLEXERIL) tablet 10 mg (has no administration in time range)    ED Course  I have reviewed the triage vital signs and the nursing notes.  Pertinent labs & imaging results that were available during my care of the patient were reviewed by me and considered in my medical decision making (see chart for details).  MDM Rules/Calculators/A&P                          Patient here with right flank and right upper back pain for several days.  Pain began after several days of swimming.  Pain reproduced with deep breathing and movement.  No pain to his chest or shortness of breath.  Vital signs reassuring.  No abdominal pain, chest pain, shortness of breath, fever or chills to suggest need for blood work.  Urinalysis unremarkable and CT stone study shows no hydronephrosis or evidence of obstructing ureteral stone.  Chest x-ray also unremarkable.  Doubt pneumonia, kidney stone, or PE.  Given anti-inflammatory and muscle relaxer  On recheck, patient resting comfortably and states that he is feeling better after medications.  I feel that his symptoms are likely musculoskeletal.  He appears appropriate for discharge home, he agrees to close outpatient follow-up with PCP and return precautions were also discussed.    Final Clinical Impression(s) / ED Diagnoses Final diagnoses:  Muscle strain of right upper back,  initial encounter    Rx / DC Orders ED Discharge Orders    None       Pauline Aus, PA-C 06/28/20 1249    Geoffery Lyons, MD 06/28/20 1515

## 2020-06-28 NOTE — Discharge Instructions (Signed)
Your urine test today did not show evidence of infection.  Your chest x-ray and CT scan did not show evidence of pneumonia or kidney stone.  You have been prescribed medications that should help with your pain.  Try to alternate ice and heat to your right upper back when possible.  Avoid heavy lifting or reaching.  Follow-up with your primary doctor or return to the emergency department if you develop worsening symptoms

## 2020-06-28 NOTE — ED Triage Notes (Signed)
Pt c/o of right flank pain since Sunday. Denies urinary s/s

## 2020-09-28 ENCOUNTER — Other Ambulatory Visit: Payer: Self-pay

## 2020-09-28 ENCOUNTER — Encounter (HOSPITAL_COMMUNITY): Payer: Self-pay

## 2020-09-28 ENCOUNTER — Emergency Department (HOSPITAL_COMMUNITY)
Admission: EM | Admit: 2020-09-28 | Discharge: 2020-09-28 | Disposition: A | Discharge status: Home / Self Care | Payer: HRSA Program | Attending: Emergency Medicine | Admitting: Emergency Medicine

## 2020-09-28 DIAGNOSIS — F17290 Nicotine dependence, other tobacco product, uncomplicated: Secondary | ICD-10-CM | POA: Diagnosis not present

## 2020-09-28 DIAGNOSIS — U071 COVID-19: Secondary | ICD-10-CM | POA: Insufficient documentation

## 2020-09-28 DIAGNOSIS — F1590 Other stimulant use, unspecified, uncomplicated: Secondary | ICD-10-CM | POA: Diagnosis not present

## 2020-09-28 LAB — RESPIRATORY PANEL BY RT PCR (FLU A&B, COVID)
Influenza A by PCR: NEGATIVE
Influenza B by PCR: NEGATIVE
SARS Coronavirus 2 by RT PCR: POSITIVE — AB

## 2020-09-28 NOTE — Discharge Instructions (Addendum)
Thank you for allowing [place] to care for you today.   Please return to the emergency department if you have any new or worsening symptoms.  You tested positive for covid-19 today.   Medications- You can take medications to help treat your symptoms: -Tylenol for fever and body aches. Please take as prescribed on the bottle. -Over the coutner cough medicine such as mucinex, robitussin, or other brands. -Flonase or saline nasal spray for nasal congestion -Vitamins as recommended by CDC  Treatment- This is a virus and unfortunately there are no antibitotics approved to treat this virus at this time. It is important to monitor your symptoms closely: -You should have a theremometer at home to check your temperature when feeling feverish. -Use a pulse ox meter to measure your oxygen when feeling short of breath.  -If your fever is over 100.4 despite taking tylenol or if your oxygen level drops below 94% these are reasons to return to the emergency department for further evaluation. Please call the emergency department before you come to make [place] aware.   Quarantine for 10 days starting today. You can return to work when you are fever free for 24 hours on day 10 without use of ibuprofen and tylenol.  Again: symptoms of shortness of breath, chest pain, difficulty breathing, new onset of confusion, any symptoms that are concerning. If any of these symptoms you should come to emergency department for evaluation.   I hope you feel better soon

## 2020-09-28 NOTE — ED Provider Notes (Signed)
[facility] EMERGENCY DEPARTMENT Provider Note   CSN: [id number] Arrival date & time: 09/28/20  1958     History Chief Complaint  Patient presents with  . Covid Exposure    KYANDRE White is a 34 y.o. male with noncontributory past medical history.  HPI Patient presents to emergency department today with chief complaint of Covid exposure.  Patient states he has been around his brother who tested positive for Covid last week.  Patient lost his sense of taste and smell yesterday.  His job is requesting that he have a Covid test before returning to work.  Patient denies any other symptoms.  No medications for symptoms prior to arrival.  Denies fever, chills, cough, shortness of breath, chest pain, abdominal pain, nausea, vomiting, urinary symptoms, diarrhea, rash.  Not vaccinated against Covid.    History reviewed. No pertinent past medical history.  There are no problems to display for this patient.   Past Surgical History:  Procedure Laterality Date  . ROTATOR CUFF REPAIR     right       History reviewed. No pertinent family history.  Social History   Tobacco Use  . Smoking status: Current Some Day Smoker    Types: Cigars  . Smokeless tobacco: Never Used  Vaping Use  . Vaping Use: Never used  Substance Use Topics  . Alcohol use: Yes  . Drug use: Yes    Types: Marijuana    Home Medications Prior to Admission medications   Medication Sig Start Date End Date Taking? Authorizing Provider  cyclobenzaprine (FLEXERIL) 10 MG tablet Take 1 tablet (10 mg total) by mouth 3 (three) times daily as needed. 06/28/20   Triplett, Tammy, PA-C  diclofenac (VOLTAREN) 75 MG EC tablet Take 1 tablet (75 mg total) by mouth 2 (two) times daily. Take with food 06/28/20   Triplett, Tammy, PA-C  ibuprofen (ADVIL,MOTRIN) 200 MG tablet Take 400 mg by mouth every 6 (six) hours as needed. For pain    [provider]    Allergies    Patient has no known allergies.  Review of Systems    Review of Systems All other systems are reviewed and are negative for acute change except as noted in the HPI.  Physical Exam Updated Vital Signs BP (!) 141/76 (BP Location: Right Arm)   Pulse 68   Temp 98.5 F (36.9 C) (Oral)   Resp 18   Ht 6' (1.829 m)   Wt 68 kg   SpO2 99%   BMI 20.34 kg/m   Physical Exam Vitals and nursing note reviewed.  Constitutional:      Appearance: He is well-developed. He is not ill-appearing or toxic-appearing.  HENT:     Head: Normocephalic and atraumatic.     Nose: Nose normal.  Eyes:     General: No scleral icterus.       Right eye: No discharge.        Left eye: No discharge.     Conjunctiva/sclera: Conjunctivae normal.  Neck:     Vascular: No JVD.  Cardiovascular:     Rate and Rhythm: Normal rate and regular rhythm.     Pulses: Normal pulses.     Heart sounds: Normal heart sounds.  Pulmonary:     Effort: Pulmonary effort is normal. No respiratory distress.     Breath sounds: Normal breath sounds. No wheezing or rales.  Chest:     Chest wall: No tenderness.  Abdominal:     General: There is no distension.  Musculoskeletal:        General: Normal range of motion.     Cervical back: Normal range of motion.  Skin:    General: Skin is warm and dry.     Findings: No rash.  Neurological:     Mental Status: He is oriented to person, place, and time.     GCS: GCS eye subscore is 4. GCS verbal subscore is 5. GCS motor subscore is 6.     Comments: Fluent speech, no facial droop.  Psychiatric:        Behavior: Behavior normal.     ED Results / Procedures / Treatments   Labs (all labs ordered are listed, but only abnormal results are displayed) Labs Reviewed  RESPIRATORY PANEL BY RT PCR (FLU A&B, COVID) - Abnormal; Notable for the following components:      Result Value   SARS Coronavirus 2 by RT PCR POSITIVE (*)    All other components within normal limits    EKG None  Radiology No results found.  Procedures Procedures  (including critical care time)  Medications Ordered in ED Medications - No data to display  ED Course  I have reviewed the triage vital signs and the nursing notes.  Pertinent labs & imaging results that were available during my care of the patient were reviewed by me and considered in my medical decision making (see chart for details).    MDM Rules/Calculators/A&P                          History provided by patient with additional history obtained from chart review.    VSS. Exam is benign.  Normal WOB. No fever, tachypnea, tachycardia, hypoxemia. Lungs are CTAB. I do not think that a CXR is indicated at this time as VS are WNL, there are no signs of consolidation on auscultation and there is no hypoxia, increased WOB or other concerning features to exam. No significant h/o immunocompromise. Doubt bacterial bronchitis or pneumonia.  No signs or symptoms to suggest strep pharyngitis.  No clinical signs of severe illness, dehydration, to warrant further emergent work up in ER. Covid test is positive.   Given reassuring physical exam, symptoms, will discharge with symptomatic treatment. Recommend telemedicine PCP f/u in the next 2-3 days for persistent symptoms  for further guidance. Self-isolation instructions discussed. Pt was given home self-isolation instructions and instructions for family members. Pt understands signs and symptoms that would warrant return to ED.  Pt comfortable and agreeable with POC.   Evan White was evaluated in Emergency Department on 09/28/2020 for the symptoms described in the history of present illness. He was evaluated in the context of the global COVID-19 pandemic, which necessitated consideration that the patient might be at risk for infection with the SARS-CoV-2 virus that causes COVID-19. Institutional protocols and algorithms that pertain to the evaluation of patients at risk for COVID-19 are in a state of rapid change based on information released by  regulatory bodies including the CDC and federal and state organizations. These policies and algorithms were followed during the patient's care in the ED.   Portions of this note were generated with Scientist, clinical (histocompatibility and immunogenetics). Dictation errors may occur despite best attempts at proofreading.   Final Clinical Impression(s) / ED Diagnoses Final diagnoses:  COVID    Rx / DC Orders ED Discharge Orders    None       Sherene Sires, PA-C 09/28/20 2157  Vanetta Mulders, MD 09/30/20 1537

## 2020-09-28 NOTE — ED Notes (Signed)
Call from lab   Pt is covid positive   His flu has to be re-run and will take 1 hour to result

## 2020-09-28 NOTE — ED Triage Notes (Signed)
Pt to er, pt states that his brother is positive for covid, states that he is here because work wont let him come back until he has a covid screening.

## 2020-09-29 ENCOUNTER — Telehealth: Payer: Self-pay | Admitting: Infectious Diseases

## 2020-09-29 NOTE — Telephone Encounter (Signed)
Called to Discuss with patient about Covid symptoms and the use of the monoclonal antibody infusion for those with mild to moderate Covid symptoms and at a high risk of hospitalization.     Pt appears to qualify for this infusion due to co-morbid conditions and/or a member of an at-risk group in accordance with the FDA Emergency Use Authorization.    He was seen at [facility] - Started symptoms about 2-3 days ago. No taste or smell at first but that has overall improved now. He would like to take our information so if he develops any new symptoms over the next 24 hours he can contact us back to be treated.  Hotline given [phone] Qualifiers include high SVI risk score, current smoker, unvaccinated.    Rexene Alberts, MSN, NP-C [facility]  [email or web address] Pager: [phone] Office: [phone] [facility] Main Line: [phone]

## 2020-10-07 ENCOUNTER — Other Ambulatory Visit: Payer: Self-pay

## 2020-10-07 DIAGNOSIS — Z20822 Contact with and (suspected) exposure to covid-19: Secondary | ICD-10-CM

## 2020-10-08 LAB — SARS-COV-2, NAA 2 DAY TAT

## 2020-10-08 LAB — NOVEL CORONAVIRUS, NAA: SARS-CoV-2, NAA: NOT DETECTED

## 2020-10-08 LAB — SPECIMEN STATUS REPORT

## 2021-10-23 ENCOUNTER — Other Ambulatory Visit: Payer: Self-pay

## 2021-10-23 ENCOUNTER — Encounter (HOSPITAL_COMMUNITY): Payer: Self-pay | Admitting: *Deleted

## 2021-10-23 ENCOUNTER — Emergency Department (HOSPITAL_COMMUNITY): Payer: No Typology Code available for payment source

## 2021-10-23 ENCOUNTER — Emergency Department (HOSPITAL_COMMUNITY)
Admission: EM | Admit: 2021-10-23 | Discharge: 2021-10-23 | Disposition: A | Discharge status: Home / Self Care | Payer: No Typology Code available for payment source | Attending: Emergency Medicine | Admitting: Emergency Medicine

## 2021-10-23 DIAGNOSIS — M25512 Pain in left shoulder: Secondary | ICD-10-CM | POA: Insufficient documentation

## 2021-10-23 DIAGNOSIS — R0789 Other chest pain: Secondary | ICD-10-CM | POA: Diagnosis not present

## 2021-10-23 DIAGNOSIS — M542 Cervicalgia: Secondary | ICD-10-CM | POA: Diagnosis not present

## 2021-10-23 DIAGNOSIS — F17290 Nicotine dependence, other tobacco product, uncomplicated: Secondary | ICD-10-CM | POA: Diagnosis not present

## 2021-10-23 DIAGNOSIS — Y92410 Unspecified street and highway as the place of occurrence of the external cause: Secondary | ICD-10-CM | POA: Insufficient documentation

## 2021-10-23 MED ORDER — METHOCARBAMOL 500 MG PO TABS: 500.0000 mg | ORAL_TABLET | Freq: Two times a day (BID) | ORAL | 0 refills | Status: AC

## 2021-10-23 NOTE — Discharge Instructions (Signed)
Please use Tylenol or ibuprofen for pain.  You may use 600 mg ibuprofen every 6 hours or 1000 mg of Tylenol every 6 hours.  You may choose to alternate between the 2.  This would be most effective.  Not to exceed 4 g of Tylenol within 24 hours.  Not to exceed 3200 mg ibuprofen 24 hours.  You can use the robaxin as needed in addition to the above. It may make you sleepy.
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
